# Patient Record
Sex: Female | Born: 1980
Health system: Southern US, Community
[De-identification: ages and names within clinical notes are randomized; demographics above are authoritative.]

## PROBLEM LIST (undated history)

## (undated) DIAGNOSIS — R87619 Unspecified abnormal cytological findings in specimens from cervix uteri: Secondary | ICD-10-CM

## (undated) DIAGNOSIS — IMO0002 Reserved for concepts with insufficient information to code with codable children: Secondary | ICD-10-CM

## (undated) HISTORY — DX: Reserved for concepts with insufficient information to code with codable children: IMO0002

## (undated) HISTORY — DX: Unspecified abnormal cytological findings in specimens from cervix uteri: R87.619

## (undated) HISTORY — PX: LEEP: SHX91

---
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
## 2004-07-17 ENCOUNTER — Other Ambulatory Visit: Admission: RE | Admit: 2004-07-17 | Discharge: 2004-07-17 | Payer: Self-pay | Admitting: Family Medicine

## 2005-07-21 ENCOUNTER — Other Ambulatory Visit: Admission: RE | Admit: 2005-07-21 | Discharge: 2005-07-21 | Payer: Self-pay | Admitting: Family Medicine

## 2006-03-08 ENCOUNTER — Other Ambulatory Visit: Admission: RE | Admit: 2006-03-08 | Discharge: 2006-03-08 | Payer: Self-pay | Admitting: Obstetrics and Gynecology

## 2006-11-25 ENCOUNTER — Other Ambulatory Visit: Admission: RE | Admit: 2006-11-25 | Discharge: 2006-11-25 | Payer: Self-pay | Admitting: Obstetrics and Gynecology

## 2007-06-14 ENCOUNTER — Other Ambulatory Visit: Admission: RE | Admit: 2007-06-14 | Discharge: 2007-06-14 | Payer: Self-pay | Admitting: Obstetrics and Gynecology

## 2008-01-31 ENCOUNTER — Other Ambulatory Visit: Admission: RE | Admit: 2008-01-31 | Discharge: 2008-01-31 | Payer: Self-pay | Admitting: Obstetrics and Gynecology

## 2008-06-01 ENCOUNTER — Inpatient Hospital Stay (HOSPITAL_COMMUNITY): Admission: AD | Admit: 2008-06-01 | Discharge: 2008-06-01 | Payer: Self-pay | Admitting: Obstetrics and Gynecology

## 2008-08-27 ENCOUNTER — Inpatient Hospital Stay (HOSPITAL_COMMUNITY): Admission: RE | Admit: 2008-08-27 | Discharge: 2008-08-29 | Payer: Self-pay | Admitting: Obstetrics and Gynecology

## 2009-01-31 ENCOUNTER — Other Ambulatory Visit: Admission: RE | Admit: 2009-01-31 | Discharge: 2009-01-31 | Payer: Self-pay | Admitting: Obstetrics and Gynecology

## 2009-10-20 ENCOUNTER — Inpatient Hospital Stay (HOSPITAL_COMMUNITY): Admission: AD | Admit: 2009-10-20 | Discharge: 2009-10-20 | Payer: Self-pay | Admitting: Obstetrics and Gynecology

## 2009-11-05 DEATH — deceased

## 2010-02-13 ENCOUNTER — Other Ambulatory Visit: Admission: RE | Admit: 2010-02-13 | Discharge: 2010-02-13 | Payer: Self-pay | Admitting: Obstetrics and Gynecology

## 2010-08-21 ENCOUNTER — Inpatient Hospital Stay (HOSPITAL_COMMUNITY)
Admission: AD | Admit: 2010-08-21 | Discharge: 2010-08-22 | Payer: Self-pay | Source: Home / Self Care | Attending: Obstetrics and Gynecology | Admitting: Obstetrics and Gynecology

## 2010-11-17 LAB — CBC
HCT: 36 % (ref 36.0–46.0)
HCT: 38 % (ref 36.0–46.0)
Hemoglobin: 12.3 g/dL (ref 12.0–15.0)
Hemoglobin: 13.1 g/dL (ref 12.0–15.0)
MCH: 31 pg (ref 26.0–34.0)
MCH: 31.7 pg (ref 26.0–34.0)
MCHC: 34.2 g/dL (ref 30.0–36.0)
MCHC: 34.5 g/dL (ref 30.0–36.0)
MCV: 90.7 fL (ref 78.0–100.0)
MCV: 92 fL (ref 78.0–100.0)
Platelets: 159 10*3/uL (ref 150–400)
RBC: 4.13 MIL/uL (ref 3.87–5.11)
RDW: 13.5 % (ref 11.5–15.5)
RDW: 14 % (ref 11.5–15.5)
WBC: 8.3 10*3/uL (ref 4.0–10.5)

## 2010-11-17 LAB — RPR: RPR Ser Ql: NONREACTIVE

## 2010-11-17 LAB — ABO/RH: ABO/RH(D): O POS

## 2010-11-26 LAB — HCG, QUANTITATIVE, PREGNANCY: hCG, Beta Chain, Quant, S: 4926 m[IU]/mL — ABNORMAL HIGH (ref <5)

## 2010-11-26 LAB — CBC
MCHC: 33.7 g/dL (ref 30.0–36.0)
MCV: 91.2 fL (ref 78.0–100.0)
Platelets: 230 10*3/uL (ref 150–400)
RDW: 12.5 % (ref 11.5–15.5)

## 2010-12-12 IMAGING — US US OB COMP LESS 14 WK
1 series · 14 of 28 positions shown · non-contrast
Comparison: None

CLINICAL DATA: *Abdominal pain; ; QUANTITATIVE BETA HCG LEVEL 6552.

OBSTETRIC <14 WK US AND TRANSVAGINAL OB US
TECHNIQUE: Both transabdominal and transvaginal ultrasound
examinations were performed for complete evaluation of the
gestation as well as the maternal uterus, adnexal regions, and
pelvic cul-de-sac.

[Series 1: us ob comp less 14 wks · 0.18mm/px · 49 acquisitions, 14 frames shown]
[im 2/49]
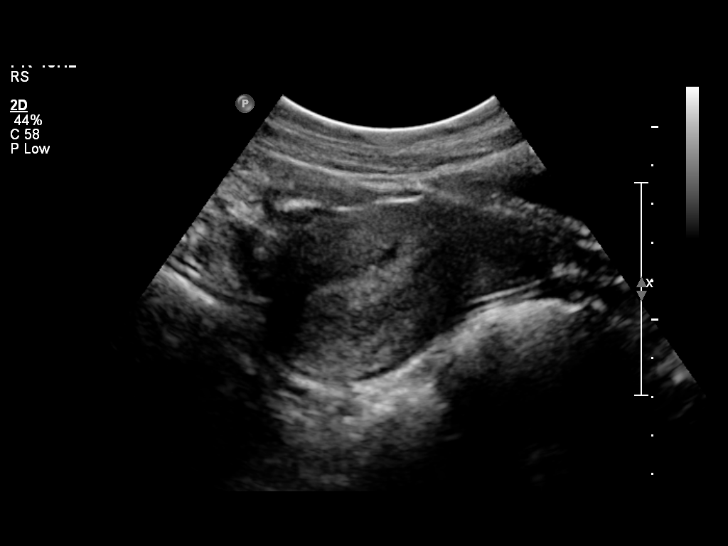
[im 6/49]
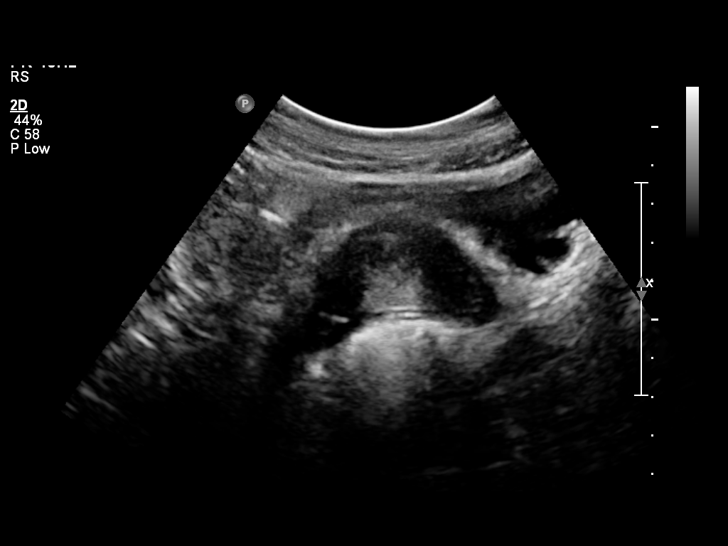
[im 9/49]
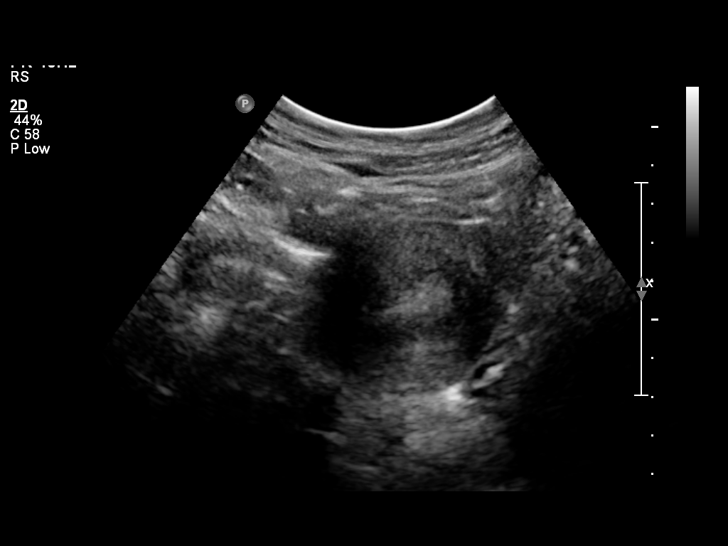
[im 13/49]
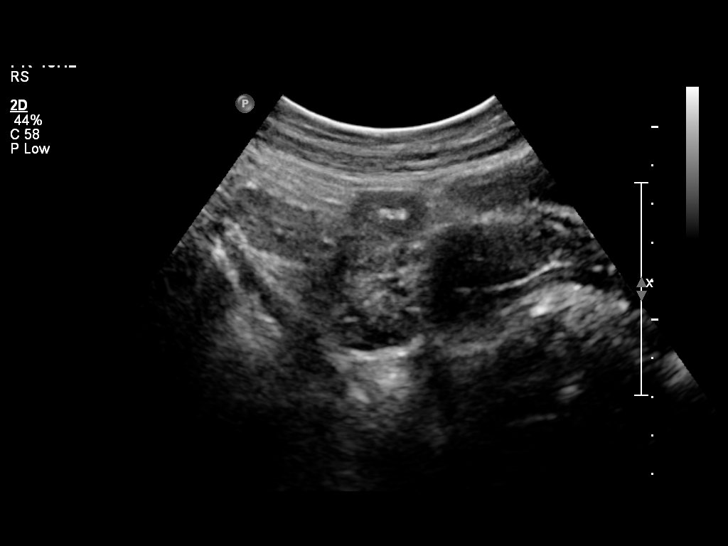
[im 17/49]
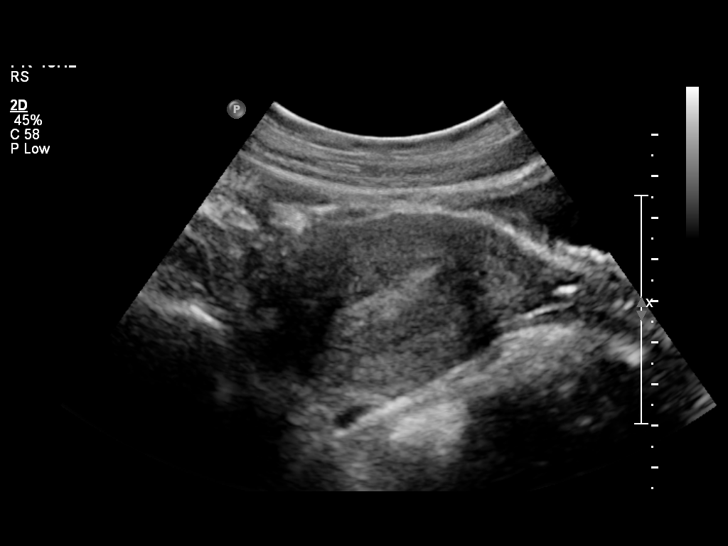
[im 20/49]
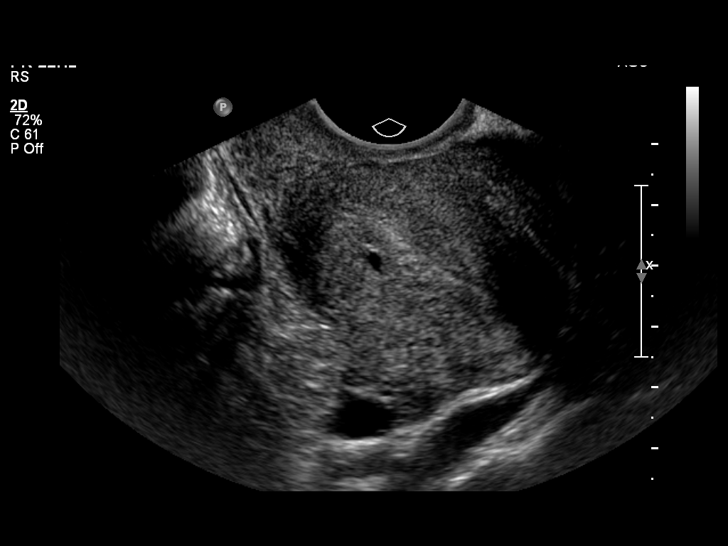
[im 24/49]
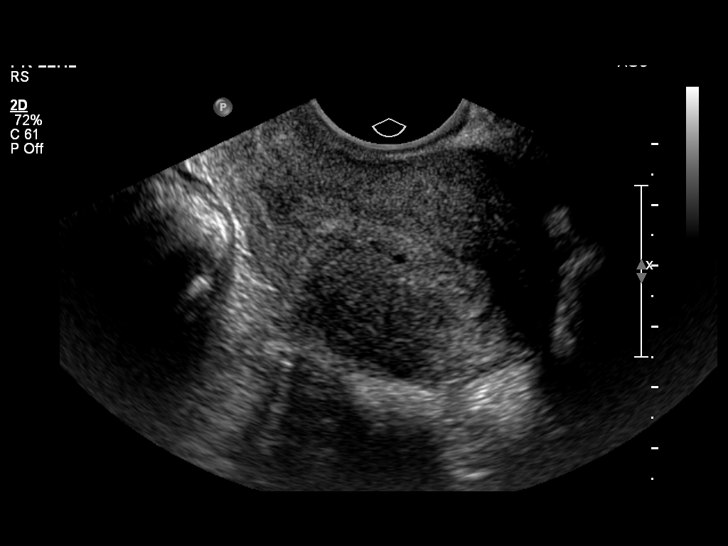
[im 27/49]
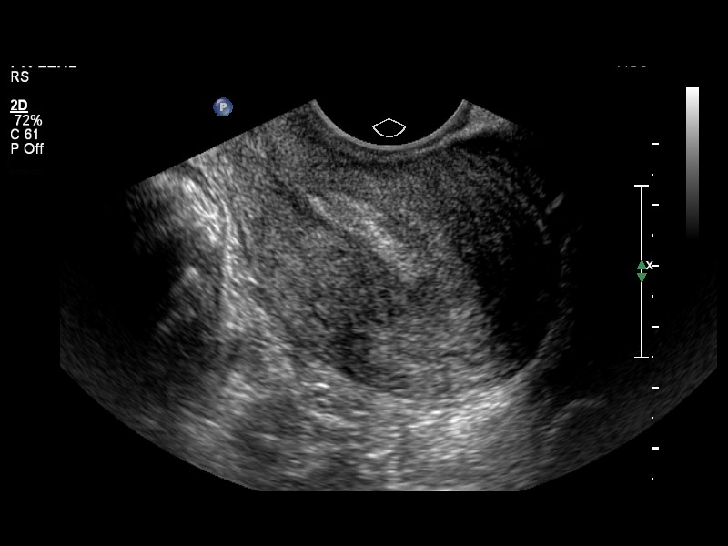
[im 31/49]
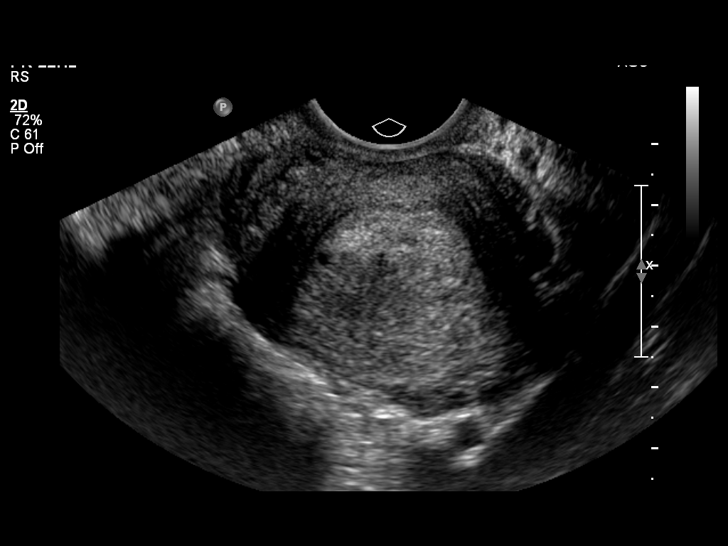
[im 34/49]
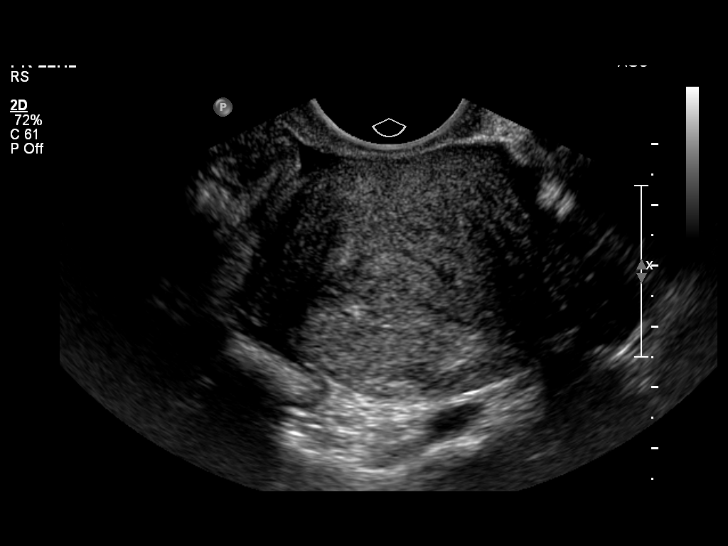
[im 38/49]
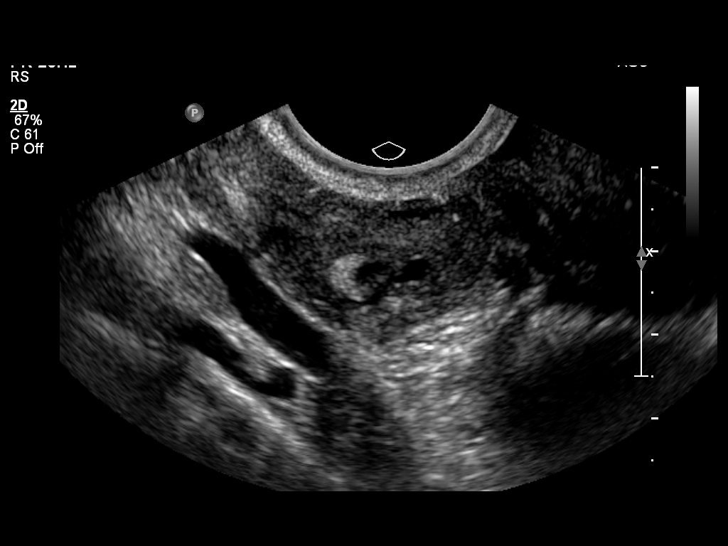
[im 41/49]
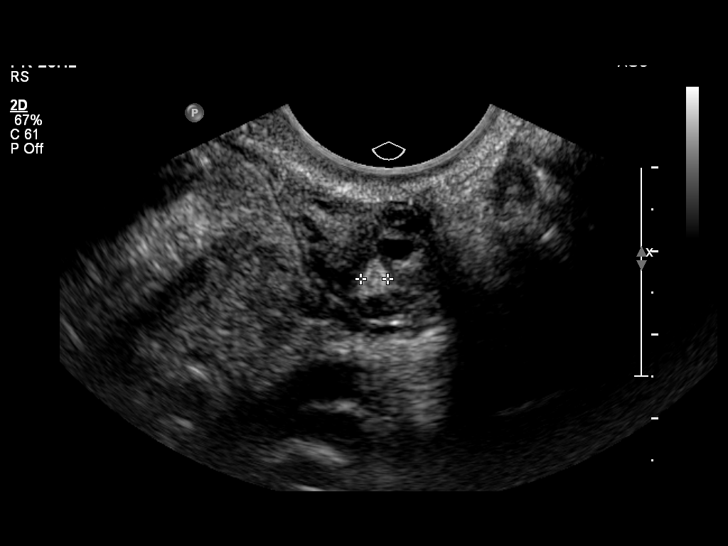
[im 45/49]
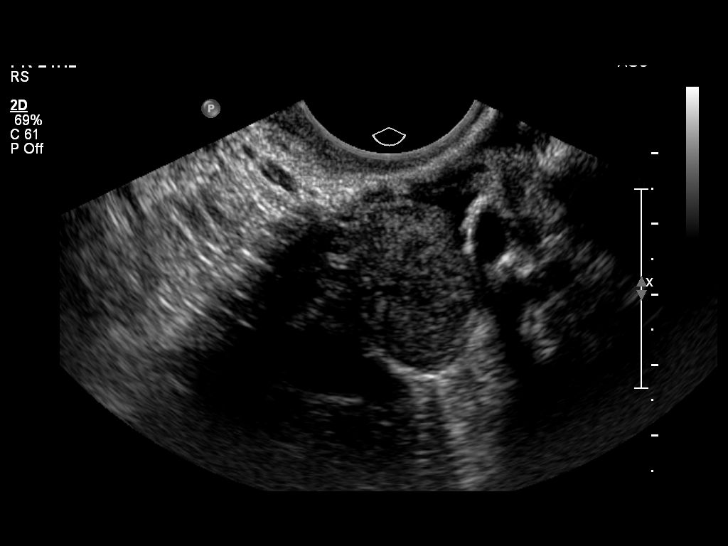
[im 49/49]
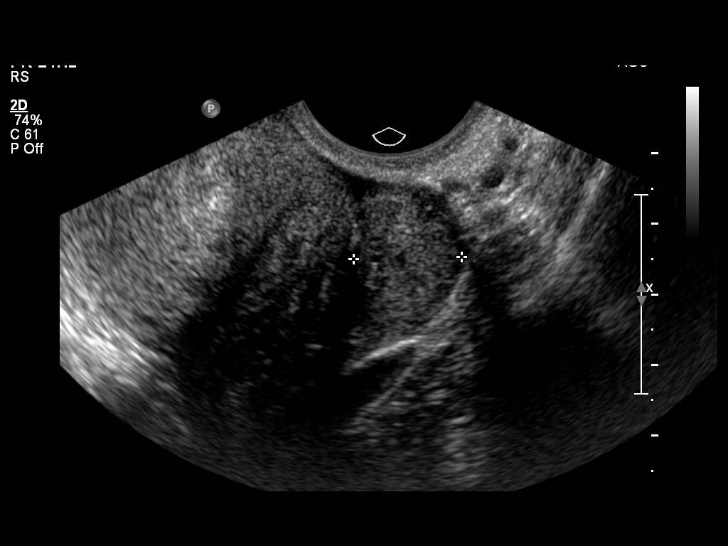

[14 of 28 positions shown; findings below may reference images not displayed]

FINDINGS: No intrauterine pregnancy identified.  The endometrium
appears thickened and mobile, likely blood.  There are a few small
cystic areas within the endometrium.  Uterus is unremarkable.

Ovaries are symmetric in size and echotexture.  Left corpus luteum
cyst seen.  No adnexal masses.  Small amount of free fluid.
IMPRESSION: No intrauterine pregnancy.

Endometrium thickened with small cystic areas.  Material within the
endometrium appears mobile, likely blood.

## 2011-01-20 NOTE — H&P (Signed)
NAME:  Kelsey Obrien, Kelsey Obrien NO.:  [id number]   MEDICAL RECORD NO.:  [id number]        PATIENT TYPE:  WINP   LOCATION:                                FACILITY:  [facility]   PHYSICIAN:  Gerald Leitz, MD          DATE OF BIRTH:  Jun 01, 1981   DATE OF ADMISSION:  08/27/2008  DATE OF DISCHARGE:                              HISTORY & PHYSICAL   The patient scheduled for induction on August 27, 2008.   HISTORY OF PRESENT ILLNESS:  This is a 30 year old G1, P0 at 52-weeks  estimated gestational age based on last menstrual period of November 14, 2007, followed by first trimester ultrasound with estimated date of  delivery August 20, 2008.  Pregnancy was complicated by history of  LEEP procedure.  The patient reports positive fetal movement.  No  leakage of fluid.  No vaginal bleeding.  No regular contractions.   PAST MEDICAL HISTORY:  Negative.   PAST SURGICAL HISTORY:  Negative.   SOCIAL HISTORY:  The patient is married.  Denies tobacco, alcohol or  illegal drug use.  She does follow with her work.   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  Last Pap  smear was Jan 31, 2008, and this was normal.   MEDICATIONS:  Prenatal vitamins.   ALLERGIES:  No known drug allergies.   FAMILY HISTORY:  Noncontributory.   REVIEW OF SYSTEMS:  Negative.   PHYSICAL EXAMINATION:  VITAL SIGNS:  Blood pressure is 126/70 and weight  is 157 pounds.  CARDIOVASCULAR:  Regular rate and rhythm.  LUNGS:  Clear to auscultation bilaterally.  ABDOMEN:  Soft, nontender, and nondistended.  EXTREMITIES:  No clubbing, cyanosis, or edema.  Cervix is 2 cm, 75%  effaced, ballotable, vertex presentation.  Fetal heart rate 140s.   LABORATORY DATA:  Group B strep is positive.  Blood type is O positive.  HIV is nonreactive.  The patient had an abnormal glucose challenge of  150, however, her 3-hour GTT was normal.   IMPRESSION AND PLAN:  A 41-weeks' intrauterine pregnancy postdate with  GBS  positive, __________ induction, penicillin for GBS prophylaxis,  anticipate spontaneous vaginal delivery.      Gerald Leitz, MD  Electronically Signed     TC/MEDQ  D:  08/22/2008  T:  08/23/2008  Job:  [id number]

## 2011-02-15 ENCOUNTER — Other Ambulatory Visit (HOSPITAL_COMMUNITY)
Admission: RE | Admit: 2011-02-15 | Discharge: 2011-02-15 | Disposition: A | Discharge status: Home / Self Care | Payer: BC Managed Care – PPO | Source: Ambulatory Visit | Attending: Obstetrics and Gynecology | Admitting: Obstetrics and Gynecology

## 2011-02-15 DIAGNOSIS — Z01419 Encounter for gynecological examination (general) (routine) without abnormal findings: Secondary | ICD-10-CM | POA: Insufficient documentation

## 2011-02-16 ENCOUNTER — Other Ambulatory Visit: Payer: Self-pay | Admitting: Obstetrics and Gynecology

## 2011-06-08 LAB — URINALYSIS, ROUTINE W REFLEX MICROSCOPIC
Bilirubin Urine: NEGATIVE
Ketones, ur: NEGATIVE
Nitrite: NEGATIVE
Protein, ur: NEGATIVE
pH: 7

## 2011-06-08 LAB — URINE MICROSCOPIC-ADD ON

## 2011-06-12 LAB — CBC
HCT: 31.4 % — ABNORMAL LOW (ref 36.0–46.0)
HCT: 36.5 % (ref 36.0–46.0)
Hemoglobin: 11 g/dL — ABNORMAL LOW (ref 12.0–15.0)
Hemoglobin: 12.8 g/dL (ref 12.0–15.0)
MCHC: 35 g/dL (ref 30.0–36.0)
MCHC: 35.2 g/dL (ref 30.0–36.0)
MCV: 93.1 fL (ref 78.0–100.0)
MCV: 93.8 fL (ref 78.0–100.0)
Platelets: 145 10*3/uL — ABNORMAL LOW (ref 150–400)
Platelets: 187 10*3/uL (ref 150–400)
RBC: 3.35 MIL/uL — ABNORMAL LOW (ref 3.87–5.11)
RBC: 3.92 MIL/uL (ref 3.87–5.11)
RDW: 12.9 % (ref 11.5–15.5)
RDW: 13.2 % (ref 11.5–15.5)
WBC: 10.7 10*3/uL — ABNORMAL HIGH (ref 4.0–10.5)
WBC: 11.8 10*3/uL — ABNORMAL HIGH (ref 4.0–10.5)

## 2011-06-19 LAB — OB RESULTS CONSOLE ABO/RH: RH Type: POSITIVE

## 2011-06-19 LAB — OB RESULTS CONSOLE RUBELLA ANTIBODY, IGM: Rubella: IMMUNE

## 2011-06-19 LAB — OB RESULTS CONSOLE HIV ANTIBODY (ROUTINE TESTING): HIV: NONREACTIVE

## 2011-06-19 LAB — OB RESULTS CONSOLE ANTIBODY SCREEN: Antibody Screen: NEGATIVE

## 2011-09-08 NOTE — L&D Delivery Note (Signed)
Delivery Note At 11:06 AM a viable female was delivered via Vaginal, Spontaneous Delivery (Presentation:Occiput Anterior ;  ).  APGAR:9 , 9; weight .   Placenta status:complete and 3 VC  Cord:  with the following complications:None  .  Cord pH: NA  Anesthesia: Epidural  Episiotomy: none  Lacerations: None Suture Repair: NA Est. Blood Loss (mL): 150 ml  Mom to postpartum.  Baby to nursery-stable.  Kelsey Obrien J. 01/19/2012, 11:17 AM

## 2011-12-29 LAB — OB RESULTS CONSOLE GBS: GBS: NEGATIVE

## 2012-01-15 ENCOUNTER — Telehealth (HOSPITAL_COMMUNITY): Payer: Self-pay | Admitting: *Deleted

## 2012-01-15 ENCOUNTER — Encounter (HOSPITAL_COMMUNITY): Payer: Self-pay | Admitting: *Deleted

## 2012-01-15 NOTE — Telephone Encounter (Signed)
Preadmission screen  

## 2012-01-18 ENCOUNTER — Other Ambulatory Visit: Payer: Self-pay | Admitting: Obstetrics and Gynecology

## 2012-01-19 ENCOUNTER — Encounter (HOSPITAL_COMMUNITY): Payer: Self-pay | Admitting: Anesthesiology

## 2012-01-19 ENCOUNTER — Inpatient Hospital Stay (HOSPITAL_COMMUNITY)
Admission: RE | Admit: 2012-01-19 | Discharge: 2012-01-20 | DRG: 373 | Disposition: A | Discharge status: Home / Self Care | Payer: BC Managed Care – PPO | Source: Ambulatory Visit | Attending: Obstetrics and Gynecology | Admitting: Obstetrics and Gynecology

## 2012-01-19 ENCOUNTER — Inpatient Hospital Stay (HOSPITAL_COMMUNITY): Payer: BC Managed Care – PPO | Admitting: Anesthesiology

## 2012-01-19 ENCOUNTER — Encounter (HOSPITAL_COMMUNITY): Payer: Self-pay

## 2012-01-19 DIAGNOSIS — O480 Post-term pregnancy: Principal | ICD-10-CM

## 2012-01-19 LAB — CBC
HCT: 39.5 % (ref 36.0–46.0)
Hemoglobin: 13.2 g/dL (ref 12.0–15.0)
MCH: 30.1 pg (ref 26.0–34.0)
MCHC: 33.4 g/dL (ref 30.0–36.0)
MCV: 90 fL (ref 78.0–100.0)

## 2012-01-19 MED ORDER — FENTANYL 2.5 MCG/ML BUPIVACAINE 1/10 % EPIDURAL INFUSION (WH - ANES)
14.0000 mL/h | INTRAMUSCULAR | Status: DC
  Administered 2012-01-19: 14 mL/h via EPIDURAL
  Filled 2012-01-19: qty 60

## 2012-01-19 MED ORDER — FLEET ENEMA 7-19 GM/118ML RE ENEM: 1.0000 | ENEMA | RECTAL | Status: DC | PRN

## 2012-01-19 MED ORDER — PHENYLEPHRINE 40 MCG/ML (10ML) SYRINGE FOR IV PUSH (FOR BLOOD PRESSURE SUPPORT)
80.0000 ug | PREFILLED_SYRINGE | INTRAVENOUS | Status: DC | PRN
  Filled 2012-01-19: qty 5

## 2012-01-19 MED ORDER — IBUPROFEN 600 MG PO TABS
600.0000 mg | ORAL_TABLET | Freq: Four times a day (QID) | ORAL | Status: DC
  Administered 2012-01-19 – 2012-01-20 (×4): 600 mg via ORAL
  Filled 2012-01-19 (×4): qty 1

## 2012-01-19 MED ORDER — DIBUCAINE 1 % RE OINT: 1.0000 "application " | TOPICAL_OINTMENT | RECTAL | Status: DC | PRN

## 2012-01-19 MED ORDER — WITCH HAZEL-GLYCERIN EX PADS: 1.0000 "application " | MEDICATED_PAD | CUTANEOUS | Status: DC | PRN

## 2012-01-19 MED ORDER — PRENATAL MULTIVITAMIN CH
1.0000 | ORAL_TABLET | Freq: Every day | ORAL | Status: DC
  Administered 2012-01-19 – 2012-01-20 (×2): 1 via ORAL
  Filled 2012-01-19 (×2): qty 1

## 2012-01-19 MED ORDER — LIDOCAINE HCL (PF) 1 % IJ SOLN
INTRAMUSCULAR | Status: DC | PRN
  Administered 2012-01-19 (×2): 5 mL

## 2012-01-19 MED ORDER — OXYTOCIN 20 UNITS IN LACTATED RINGERS INFUSION - SIMPLE: 125.0000 mL/h | Freq: Once | INTRAVENOUS | Status: DC

## 2012-01-19 MED ORDER — LACTATED RINGERS IV SOLN
INTRAVENOUS | Status: DC
  Administered 2012-01-19 (×2): via INTRAVENOUS

## 2012-01-19 MED ORDER — LIDOCAINE HCL (PF) 1 % IJ SOLN: 30.0000 mL | INTRAMUSCULAR | Status: DC | PRN

## 2012-01-19 MED ORDER — TETANUS-DIPHTH-ACELL PERTUSSIS 5-2.5-18.5 LF-MCG/0.5 IM SUSP: 0.5000 mL | Freq: Once | INTRAMUSCULAR | Status: DC

## 2012-01-19 MED ORDER — IBUPROFEN 600 MG PO TABS: 600.0000 mg | ORAL_TABLET | Freq: Four times a day (QID) | ORAL | Status: DC | PRN

## 2012-01-19 MED ORDER — CITRIC ACID-SODIUM CITRATE 334-500 MG/5ML PO SOLN: 30.0000 mL | ORAL | Status: DC | PRN

## 2012-01-19 MED ORDER — SENNOSIDES-DOCUSATE SODIUM 8.6-50 MG PO TABS
2.0000 | ORAL_TABLET | Freq: Every day | ORAL | Status: DC
  Administered 2012-01-19: 2 via ORAL

## 2012-01-19 MED ORDER — METHYLERGONOVINE MALEATE 0.2 MG PO TABS: 0.2000 mg | ORAL_TABLET | ORAL | Status: DC | PRN

## 2012-01-19 MED ORDER — LANOLIN HYDROUS EX OINT: TOPICAL_OINTMENT | CUTANEOUS | Status: DC | PRN

## 2012-01-19 MED ORDER — BENZOCAINE-MENTHOL 20-0.5 % EX AERO: 1.0000 "application " | INHALATION_SPRAY | CUTANEOUS | Status: DC | PRN

## 2012-01-19 MED ORDER — METHYLERGONOVINE MALEATE 0.2 MG/ML IJ SOLN: 0.2000 mg | INTRAMUSCULAR | Status: DC | PRN

## 2012-01-19 MED ORDER — ONDANSETRON HCL 4 MG/2ML IJ SOLN: 4.0000 mg | INTRAMUSCULAR | Status: DC | PRN

## 2012-01-19 MED ORDER — TERBUTALINE SULFATE 1 MG/ML IJ SOLN: 0.2500 mg | Freq: Once | INTRAMUSCULAR | Status: DC | PRN

## 2012-01-19 MED ORDER — OXYCODONE-ACETAMINOPHEN 5-325 MG PO TABS: 1.0000 | ORAL_TABLET | ORAL | Status: DC | PRN

## 2012-01-19 MED ORDER — OXYTOCIN BOLUS FROM INFUSION
500.0000 mL | Freq: Once | INTRAVENOUS | Status: DC
  Filled 2012-01-19: qty 500

## 2012-01-19 MED ORDER — LACTATED RINGERS IV SOLN: 500.0000 mL | Freq: Once | INTRAVENOUS | Status: DC

## 2012-01-19 MED ORDER — ONDANSETRON HCL 4 MG/2ML IJ SOLN: 4.0000 mg | Freq: Four times a day (QID) | INTRAMUSCULAR | Status: DC | PRN

## 2012-01-19 MED ORDER — ONDANSETRON HCL 4 MG PO TABS: 4.0000 mg | ORAL_TABLET | ORAL | Status: DC | PRN

## 2012-01-19 MED ORDER — EPHEDRINE 5 MG/ML INJ
10.0000 mg | INTRAVENOUS | Status: DC | PRN
  Filled 2012-01-19: qty 4

## 2012-01-19 MED ORDER — BUTORPHANOL TARTRATE 2 MG/ML IJ SOLN: 1.0000 mg | INTRAMUSCULAR | Status: DC | PRN

## 2012-01-19 MED ORDER — ACETAMINOPHEN 325 MG PO TABS: 650.0000 mg | ORAL_TABLET | ORAL | Status: DC | PRN

## 2012-01-19 MED ORDER — LACTATED RINGERS IV SOLN: 500.0000 mL | INTRAVENOUS | Status: DC | PRN

## 2012-01-19 MED ORDER — OXYTOCIN 20 UNITS IN LACTATED RINGERS INFUSION - SIMPLE
1.0000 m[IU]/min | INTRAVENOUS | Status: DC
  Administered 2012-01-19: 2 m[IU]/min via INTRAVENOUS
  Filled 2012-01-19: qty 1000

## 2012-01-19 MED ORDER — SIMETHICONE 80 MG PO CHEW: 80.0000 mg | CHEWABLE_TABLET | ORAL | Status: DC | PRN

## 2012-01-19 MED ORDER — EPHEDRINE 5 MG/ML INJ: 10.0000 mg | INTRAVENOUS | Status: DC | PRN

## 2012-01-19 MED ORDER — DIPHENHYDRAMINE HCL 50 MG/ML IJ SOLN: 12.5000 mg | INTRAMUSCULAR | Status: DC | PRN

## 2012-01-19 MED ORDER — PHENYLEPHRINE 40 MCG/ML (10ML) SYRINGE FOR IV PUSH (FOR BLOOD PRESSURE SUPPORT): 80.0000 ug | PREFILLED_SYRINGE | INTRAVENOUS | Status: DC | PRN

## 2012-01-19 MED ORDER — ZOLPIDEM TARTRATE 5 MG PO TABS: 5.0000 mg | ORAL_TABLET | Freq: Every evening | ORAL | Status: DC | PRN

## 2012-01-19 MED ORDER — DIPHENHYDRAMINE HCL 25 MG PO CAPS: 25.0000 mg | ORAL_CAPSULE | Freq: Four times a day (QID) | ORAL | Status: DC | PRN

## 2012-01-19 NOTE — Anesthesia Preprocedure Evaluation (Signed)

## 2012-01-19 NOTE — H&P (Signed)
Kelsey Obrien is a 31 y.o. female presenting for  Induction due to post dates at 40 wks and 3 days. Pregnancy has been uncomplicated. Pt reports occasional contractions. + FM no lof no VB  OB History    Grav Para Term Preterm Abortions TAB SAB Ect Mult Living   4 2 2  0 1 0 1 0 0 2     Past Medical History  Diagnosis Date  . Abnormal Pap smear    Past Surgical History  Procedure Date  . Leep    Family History: family history includes Depression in her maternal grandmother. Social History:  reports that she has never smoked. She has never used smokeless tobacco. She reports that she does not drink alcohol or use illicit drugs.  Meds Tylenol prn and Flinstone vitamins   Dilation: 4 Effacement (%): 50 Station: -1 Exam by:: Athira Janowicz Blood pressure 121/85, pulse 80, temperature 98 F (36.7 C), temperature source Oral, height 5\' 2"  (1.575 m), weight 70.761 kg (156 lb), last menstrual period 04/11/2011. CV rrr  Lungs clear  Abd Gravid nontender Ext trace edema  Pelvic cx  4/50/-1 Arom clear fluid. FHR category I tracing Toco occasional contraction Prenatal labs: ABO, Rh: O/Positive/-- (10/12 0000) Antibody: Negative (10/12 0000) Rubella:   immune RPR: Nonreactive (10/12 0000)  HBsAg: Negative (10/12 0000)  HIV: Non-reactive (10/12 0000)  GBS: Negative (04/23 0000)   Assessment/Plan: 40 wks and 3 days post dates for induction Arom performed if no cervical change in 1 hr add pitocin for augmentation Anticipate svd.    Kelsey Obrien J. 01/19/2012, 8:19 AM

## 2012-01-19 NOTE — Anesthesia Procedure Notes (Signed)
Epidural Patient location during procedure: OB Start time: 01/19/2012 10:22 AM  Staffing Anesthesiologist: Brayton Caves R Performed by: anesthesiologist   Preanesthetic Checklist Completed: patient identified, site marked, surgical consent, pre-op evaluation, timeout performed, IV checked, risks and benefits discussed and monitors and equipment checked  Epidural Patient position: sitting Prep: site prepped and draped and DuraPrep Patient monitoring: continuous pulse ox and blood pressure Approach: midline Injection technique: LOR air and LOR saline  Needle:  Needle type: Tuohy  Needle gauge: 17 G Needle length: 9 cm Needle insertion depth: 5 cm cm Catheter type: closed end flexible Catheter size: 19 Gauge Catheter at skin depth: 10 cm Test dose: negative  Assessment Events: blood not aspirated, injection not painful, no injection resistance, negative IV test and no paresthesia  Additional Notes Patient identified.  Risk benefits discussed including failed block, incomplete pain control, headache, nerve damage, paralysis, blood pressure changes, nausea, vomiting, reactions to medication both toxic or allergic, and postpartum back pain.  Patient expressed understanding and wished to proceed.  All questions were answered.  Sterile technique used throughout procedure and epidural site dressed with sterile barrier dressing. No paresthesia or other complications noted.The patient did not experience any signs of intravascular injection such as tinnitus or metallic taste in mouth nor signs of intrathecal spread such as rapid motor block. Please see nursing notes for vital signs.

## 2012-01-19 NOTE — Anesthesia Postprocedure Evaluation (Signed)
  Anesthesia Post-op Note  Patient: Kelsey Obrien  Procedure(s) Performed: * No procedures listed *  Patient Location: PACU and Mother/Baby  Anesthesia Type: Epidural  Level of Consciousness: awake, alert  and oriented  Airway and Oxygen Therapy: Patient Spontanous Breathing  Post-op Pain: mild  Post-op Assessment: Patient's Cardiovascular Status Stable, Respiratory Function Stable, Pain level controlled, No headache, No backache, No residual numbness and No residual motor weakness  Post-op Vital Signs: stable  Complications: No apparent anesthesia complications

## 2012-01-20 DIAGNOSIS — O480 Post-term pregnancy: Secondary | ICD-10-CM

## 2012-01-20 LAB — CBC
HCT: 36.1 % (ref 36.0–46.0)
Hemoglobin: 12 g/dL (ref 12.0–15.0)
MCH: 30.2 pg (ref 26.0–34.0)
MCHC: 33.2 g/dL (ref 30.0–36.0)
MCV: 90.9 fL (ref 78.0–100.0)
Platelets: 208 10*3/uL (ref 150–400)
RBC: 3.97 MIL/uL (ref 3.87–5.11)
RDW: 14 % (ref 11.5–15.5)
WBC: 10.2 10*3/uL (ref 4.0–10.5)

## 2012-01-20 MED ORDER — IBUPROFEN 600 MG PO TABS: 600.0000 mg | ORAL_TABLET | Freq: Four times a day (QID) | ORAL | Status: AC | PRN

## 2012-01-20 NOTE — Discharge Summary (Signed)
Obstetric Discharge Summary Reason for Admission: induction of labor Prenatal Procedures: none Intrapartum Procedures: spontaneous vaginal delivery Postpartum Procedures: none Complications-Operative and Postpartum: none Hemoglobin  Date Value Range Status  01/20/2012 12.0  12.0-15.0 (g/dL) Final     HCT  Date Value Range Status  01/20/2012 36.1  36.0-46.0 (%) Final    Physical Exam:  General: alert and cooperative Lochia: appropriate Uterine Fundus: firm Incision: NA DVT Evaluation: No evidence of DVT seen on physical exam.  Discharge Diagnoses: Term Pregnancy-delivered  Discharge Information: Date: 01/20/2012 Activity: pelvic rest Diet: routine Medications: Ibuprofen and flinstone Condition: stable Instructions: refer to practice specific booklet Discharge to: home Follow-up Information    Follow up with Jessee Avers., MD in 6 weeks.   Contact information:   [address] [place] [address] [phone]          Newborn Data: Live born female  Birth Weight: 7 lb 12.9 oz (3541 g) APGAR: 9, 9  Home with mother.  Nicoles Sedlacek J. 01/20/2012, 8:43 AM

## 2012-01-20 NOTE — Progress Notes (Signed)
Post Partum Day1 s/p SVD Subjective: no complaints, up ad lib, voiding and tolerating PO Pt desires early discharge   Objective: Blood pressure 116/79, pulse 88, temperature 97.8 F (36.6 C), temperature source Oral, resp. rate 18, height 5\' 2"  (1.575 m), weight 70.761 kg (156 lb), last menstrual period 04/11/2011, SpO2 97.00%, unknown if currently breastfeeding.  Physical Exam:  General: alert and cooperative Lochia: appropriate Uterine Fundus: firm Incision: NA DVT Evaluation: No evidence of DVT seen on physical exam.   Basename 01/20/12 0525 01/19/12 0754  HGB 12.0 13.2  HCT 36.1 39.5    Assessment/Plan: Discharge home and Breastfeeding   LOS: 1 day   Jameis Newsham J. 01/20/2012, 8:38 AM

## 2012-02-10 ENCOUNTER — Ambulatory Visit (HOSPITAL_COMMUNITY)
Admission: RE | Admit: 2012-02-10 | Discharge: 2012-02-10 | Disposition: A | Discharge status: Home / Self Care | Payer: BC Managed Care – PPO | Source: Ambulatory Visit | Attending: Obstetrics and Gynecology | Admitting: Obstetrics and Gynecology

## 2012-02-10 ENCOUNTER — Ambulatory Visit (HOSPITAL_COMMUNITY): Admission: RE | Admit: 2012-02-10 | Payer: BC Managed Care – PPO | Source: Ambulatory Visit

## 2012-02-10 NOTE — Progress Notes (Addendum)
Infant Lactation Consultation Outpatient Visit Note  Patient Name: Kelsey Obrien  ZOXW name: Kelsey Obrien Date of Birth: Jun 10, 1981                      DOB- [birth year]                      Today's date- 02/10/12 (41 weeks old) Birth Weight:                                        Birth weight- 7 lbs. 12 oz.  Today's weight - 8 lbs 2oz Gestational Age at Delivery: Gestational Age: <None>  39 weeks Type of Delivery: Vaginal Delivery  Breastfeeding History Frequency of Breastfeeding: every 3 hours Length of Feeding: 10 mins per side Voids: 8-9 Stools: 4 yellow seedy  Supplementing / Method: Pumping:  Type of Pump: medela PIS   Frequency:  After every other  Volume:  2 oz              Supplementing 1 oz after every feeding by bottle for 1 1/2 weeks per doctor's orders   Consultation Evaluation:  Initial Feeding Assessment: Pre-feed Weight:  3686 gm Post-feed Weight:  3741gm Amount Transferred:  54 ml Comments: left breast, skin to skin, in X cradle hold using alternate breast massage      Additional Feeding Assessment: Pre-feed Weight: 3741 gm Post-feed Weight: 3762 gm Amount Transferred:  25 ml Comments: from right breast using X cradle hold, and alternate breast massage Total Breast milk Transferred this Visit: 79 ml  Katniss comes in today as her baby had been a slow weight gainer in first 1-2 weeks.  Dr. Chestine Spore recommended supplementing Kelsey Obrien with 1-2 oz after breast feeding and pumping pc.  Iyanla has been following this regime for about 10 days.  Baby remains sleepy at the breast, nursing actively for about 5-10 mins, but she leaves him on for 15 or more mins.  She follows breast feeding with a bottle which he drinks quickly.  She doesn't want him to get used to the bottle.  Watched Bennye Alm in Counselling psychologist, not enough depth to latch.  Relatched him with my assistance, and explanation.  Mom stated that Kelsey Obrien hadn't been latching as deeply.  He nursed  actively, and Mom using breast compression.  She also was encouraged to strip him down to diaper.    Plan:  Try to discontinue supplement bottles To feed Kelsey Obrien closer to every 2 hrs during the day Position Grant skin to skin, turned tummy to tummy Cross cradle hold rather than cradle`hold to control latch more effectively Use breast compression to increase milk transfer from breast Pump only once a day after nursing   Follow-Up Monday, June 10th at [facility] for weight check     Judee Clara 02/10/2012, 9:24 AM

## 2012-02-15 ENCOUNTER — Ambulatory Visit (HOSPITAL_COMMUNITY)
Admission: RE | Admit: 2012-02-15 | Discharge: 2012-02-15 | Disposition: A | Discharge status: Home / Self Care | Payer: BC Managed Care – PPO | Source: Ambulatory Visit | Attending: Obstetrics and Gynecology | Admitting: Obstetrics and Gynecology

## 2012-02-15 NOTE — Progress Notes (Signed)
Adult Lactation Consultation Outpatient Visit Note  Patient Name: Kelsey Obrien    BABY: Tracey Harries Date of Birth: 04-26-1981                        DOB: [birth year] Gestational Age at Delivery:39 WEEKS  BIRTH WEIGHT: 7-12 Type of Delivery:NVD                                WEIGHT TODAY: 8-3.3  Breastfeeding History: Frequency of Breastfeeding: EVERY 2-2.5 HOURS Length of Feeding: 30 MINUTES Voids: QS Stools: QS  Supplementing / Method:NONE Pumping:  Type of Pump:PUMP IN STYLE   Frequency:ONCE PER DAY  Volume:  60 MLS  Comments:    Consultation Evaluation:Mom and baby here for follow up for slow weight gain.  Baby was receiving some formula and EBM supplements until 1 week ago.  Mom feels baby has been latching and nursing better.  Baby has gained 1 oz/5 days.  Observed baby latch on to left breast using cross cradle hold.  Assisted mom with obtaining deeper latch.  Grant sucks/swallows actively for first 5 minutes then requires stimulation and breast compression for better feeding.  Mom has a 26 year old and 90 mo old at home also.  Recommended to mom that she pumps after daytime feedings and supplements any of EBM to baby.  Plan is to continue this for 1-2 weeks and watch weight closely.  Initial Feeding Assessment:LEFT BREAST 15 MINUTES/RIGHT BREAST 15 MINUTES Pre-feed Weight:8-3.3 Post-feed Weight:8-5.9 Amount Transferred:2.6 0Z Comments:  Additional Feeding Assessment: Pre-feed Weight: Post-feed Weight: Amount Transferred: Comments:  Additional Feeding Assessment: Pre-feed Weight: Post-feed Weight: Amount Transferred: Comments:  Total Breast milk Transferred this Visit: 2.6 OZ Total Supplement Given: NONE  Additional Interventions:   Follow-Up Pedi weight check 02/19/12.  Will call [facility] office prn      Hansel Feinstein 02/15/2012, 9:36 AM

## 2012-05-30 ENCOUNTER — Other Ambulatory Visit: Payer: Self-pay | Admitting: Obstetrics and Gynecology

## 2012-05-30 ENCOUNTER — Other Ambulatory Visit (HOSPITAL_COMMUNITY)
Admission: RE | Admit: 2012-05-30 | Discharge: 2012-05-30 | Disposition: A | Discharge status: Home / Self Care | Payer: BC Managed Care – PPO | Source: Ambulatory Visit | Attending: Obstetrics and Gynecology | Admitting: Obstetrics and Gynecology

## 2012-05-30 DIAGNOSIS — Z01419 Encounter for gynecological examination (general) (routine) without abnormal findings: Secondary | ICD-10-CM | POA: Insufficient documentation

## 2014-04-24 ENCOUNTER — Other Ambulatory Visit (HOSPITAL_COMMUNITY)
Admission: RE | Admit: 2014-04-24 | Discharge: 2014-04-24 | Disposition: A | Discharge status: Home / Self Care | Payer: BC Managed Care – PPO | Source: Ambulatory Visit | Attending: Obstetrics and Gynecology | Admitting: Obstetrics and Gynecology

## 2014-04-24 ENCOUNTER — Other Ambulatory Visit: Payer: Self-pay | Admitting: Obstetrics and Gynecology

## 2014-04-24 DIAGNOSIS — R87810 Cervical high risk human papillomavirus (HPV) DNA test positive: Secondary | ICD-10-CM | POA: Insufficient documentation

## 2014-04-24 DIAGNOSIS — Z124 Encounter for screening for malignant neoplasm of cervix: Secondary | ICD-10-CM | POA: Diagnosis not present

## 2014-04-24 DIAGNOSIS — Z1151 Encounter for screening for human papillomavirus (HPV): Secondary | ICD-10-CM | POA: Diagnosis present

## 2014-04-25 LAB — CYTOLOGY - PAP

## 2014-07-09 ENCOUNTER — Encounter (HOSPITAL_COMMUNITY): Payer: Self-pay

## 2015-04-30 ENCOUNTER — Other Ambulatory Visit: Payer: Self-pay | Admitting: Obstetrics and Gynecology

## 2015-04-30 ENCOUNTER — Other Ambulatory Visit (HOSPITAL_COMMUNITY)
Admission: RE | Admit: 2015-04-30 | Discharge: 2015-04-30 | Disposition: A | Discharge status: Home / Self Care | Payer: 59 | Source: Ambulatory Visit | Attending: Obstetrics and Gynecology | Admitting: Obstetrics and Gynecology

## 2015-04-30 DIAGNOSIS — Z1151 Encounter for screening for human papillomavirus (HPV): Secondary | ICD-10-CM | POA: Diagnosis present

## 2015-04-30 DIAGNOSIS — R87810 Cervical high risk human papillomavirus (HPV) DNA test positive: Secondary | ICD-10-CM | POA: Diagnosis present

## 2015-04-30 DIAGNOSIS — Z01411 Encounter for gynecological examination (general) (routine) with abnormal findings: Secondary | ICD-10-CM | POA: Insufficient documentation

## 2015-05-01 LAB — CYTOLOGY - PAP

## 2015-06-11 ENCOUNTER — Other Ambulatory Visit: Payer: Self-pay | Admitting: Obstetrics and Gynecology

## 2015-09-23 ENCOUNTER — Emergency Department (HOSPITAL_COMMUNITY)
Admission: EM | Admit: 2015-09-23 | Discharge: 2015-09-23 | Disposition: A | Discharge status: Home / Self Care | Payer: BLUE CROSS/BLUE SHIELD | Attending: Emergency Medicine | Admitting: Emergency Medicine

## 2015-09-23 ENCOUNTER — Encounter (HOSPITAL_COMMUNITY): Payer: Self-pay | Admitting: Family Medicine

## 2015-09-23 DIAGNOSIS — N390 Urinary tract infection, site not specified: Secondary | ICD-10-CM | POA: Insufficient documentation

## 2015-09-23 DIAGNOSIS — Z79899 Other long term (current) drug therapy: Secondary | ICD-10-CM | POA: Insufficient documentation

## 2015-09-23 DIAGNOSIS — Z3202 Encounter for pregnancy test, result negative: Secondary | ICD-10-CM | POA: Diagnosis not present

## 2015-09-23 DIAGNOSIS — R319 Hematuria, unspecified: Secondary | ICD-10-CM | POA: Diagnosis present

## 2015-09-23 LAB — URINALYSIS, ROUTINE W REFLEX MICROSCOPIC
Bilirubin Urine: NEGATIVE
GLUCOSE, UA: NEGATIVE mg/dL
KETONES UR: 40 mg/dL — AB
LEUKOCYTES UA: NEGATIVE
Nitrite: NEGATIVE
PROTEIN: 100 mg/dL — AB
Specific Gravity, Urine: 1.019 (ref 1.005–1.030)
pH: 7.5 (ref 5.0–8.0)

## 2015-09-23 LAB — URINE MICROSCOPIC-ADD ON

## 2015-09-23 LAB — POC URINE PREG, ED: Preg Test, Ur: NEGATIVE

## 2015-09-23 MED ORDER — CEPHALEXIN 500 MG PO CAPS: 500.0000 mg | ORAL_CAPSULE | Freq: Four times a day (QID) | ORAL | Status: DC

## 2015-09-23 MED ORDER — CEPHALEXIN 500 MG PO CAPS
500.0000 mg | ORAL_CAPSULE | Freq: Once | ORAL | Status: AC
Start: 2015-09-23 — End: 2015-09-23
  Administered 2015-09-23: 500 mg via ORAL
  Filled 2015-09-23: qty 1

## 2015-09-23 MED ORDER — PHENAZOPYRIDINE HCL 200 MG PO TABS
200.0000 mg | ORAL_TABLET | Freq: Three times a day (TID) | ORAL | Status: DC
  Administered 2015-09-23: 200 mg via ORAL
  Filled 2015-09-23: qty 1

## 2015-09-23 MED ORDER — ONDANSETRON 8 MG PO TBDP
8.0000 mg | ORAL_TABLET | Freq: Once | ORAL | Status: AC
Start: 2015-09-23 — End: 2015-09-23
  Administered 2015-09-23: 8 mg via ORAL
  Filled 2015-09-23: qty 1

## 2015-09-23 MED ORDER — PHENAZOPYRIDINE HCL 200 MG PO TABS
200.0000 mg | ORAL_TABLET | Freq: Three times a day (TID) | ORAL | Status: DC
Start: 2015-09-23 — End: 2019-07-28

## 2015-09-23 MED ORDER — ONDANSETRON HCL 4 MG PO TABS: 4.0000 mg | ORAL_TABLET | Freq: Four times a day (QID) | ORAL | Status: DC

## 2015-09-23 NOTE — ED Notes (Signed)
Pt is complaining of diffuse abd pain but mostly in her left lower quad with vomiting. Also, has blood in urine. Pt believes she has a kidney infection. Increase in urgency and frequency and burning with urination.

## 2015-09-23 NOTE — ED Notes (Signed)
Pt has ambulated to the restroom multiple times while here in the ED.

## 2015-09-23 NOTE — Discharge Instructions (Signed)

## 2015-09-23 NOTE — ED Provider Notes (Signed)
CSN: [id number]     Arrival date & time 09/23/15  0150 History   First MD Initiated Contact with Patient 09/23/15 [phone]     Chief Complaint  Patient presents with  . Emesis  . Hematuria     (Consider location/radiation/quality/duration/timing/severity/associated sxs/prior Treatment) HPI Comments: Patient presents with complaint of painful urination for the past 4 days. No fever but reports chills. She reports symptoms worsening over time and now has nausea with vomiting, worse hot/cold chills and, tonight, started to see blood in her urine. She states that she feels like she has to urinate and continues to feel the need to go after she is finished. No vaginal discharge or flank pain. No history of kidney stones.   Patient is a 35 y.o. female presenting with hematuria. The history is provided by the patient. No language interpreter was used.  Hematuria This is a new problem. The current episode started in the past 7 days. The problem has been gradually worsening. Associated symptoms include abdominal pain, nausea and vomiting. Pertinent negatives include no chills or fever.    Past Medical History  Diagnosis Date  . Abnormal Pap smear    Past Surgical History  Procedure Laterality Date  . Leep     Family History  Problem Relation Age of Onset  . Depression Maternal Grandmother    Social History  Substance Use Topics  . Smoking status: Never Smoker   . Smokeless tobacco: Never Used  . Alcohol Use: No   OB History    Gravida Para Term Preterm AB TAB SAB Ectopic Multiple Living   4 3 3  0 1 0 1 0 0 3     Review of Systems  Constitutional: Negative for fever and chills.  Gastrointestinal: Positive for nausea, vomiting and abdominal pain.  Genitourinary: Positive for dysuria, frequency and hematuria. Negative for flank pain and vaginal discharge.  Musculoskeletal: Negative.   Neurological: Negative.       Allergies  Review of patient's allergies indicates no known  allergies.  Home Medications   Prior to Admission medications   Medication Sig Start Date End Date Taking? Authorizing Provider  acetaminophen (TYLENOL) 500 MG tablet Take 500 mg by mouth every 6 (six) hours as needed. For headaches.    Historical Provider, MD  calcium carbonate (TUMS - DOSED IN MG ELEMENTAL CALCIUM) 500 MG chewable tablet Chew 1 tablet by mouth daily as needed. For heartburn.    Historical Provider, MD  oxymetazoline (AFRIN) 0.05 % nasal spray Place 2 sprays into the nose 2 (two) times daily. For decongestion.    Historical Provider, MD  Pediatric Multivit-Minerals-C (FLINTSTONES GUMMIES PO) Take 2 strips by mouth daily.    Historical Provider, MD   BP 133/88 mmHg  Pulse 87  Temp(Src) 97.8 F (36.6 C) (Oral)  Resp 20  Ht 5\' 2"  (1.575 m)  Wt 65.772 kg  BMI 26.51 kg/m2  SpO2 100%  LMP 09/04/2015 Physical Exam  Constitutional: She is oriented to person, place, and time. She appears well-developed and well-nourished.  HENT:  Head: Normocephalic.  Neck: Normal range of motion. Neck supple.  Cardiovascular: Normal rate and regular rhythm.   Pulmonary/Chest: Effort normal and breath sounds normal.  Abdominal: Soft. Bowel sounds are normal. There is tenderness. There is no rebound and no guarding.  LLQ tenderness to soft abdomen.  Genitourinary:  No CVA tenderness.   Musculoskeletal: Normal range of motion.  Neurological: She is alert and oriented to person, place, and time.  Skin: Skin is  warm and dry. No rash noted.  Psychiatric: She has a normal mood and affect.    ED Course  Procedures (including critical care time) Labs Review Labs Reviewed  URINALYSIS, ROUTINE W REFLEX MICROSCOPIC (NOT AT [facility]) - Abnormal; Notable for the following:    APPearance TURBID (*)    Hgb urine dipstick LARGE (*)    Ketones, ur 40 (*)    Protein, ur 100 (*)    All other components within normal limits  URINE MICROSCOPIC-ADD ON - Abnormal; Notable for the following:    Squamous  Epithelial / LPF 0-5 (*)    Bacteria, UA MANY (*)    All other components within normal limits  POC URINE PREG, ED   Results for orders placed or performed during the hospital encounter of 09/23/15  Urinalysis, Routine w reflex microscopic-may I&O cath if menses (not at [facility])  Result Value Ref Range   Color, Urine YELLOW YELLOW   APPearance TURBID (A) CLEAR   Specific Gravity, Urine 1.019 1.005 - 1.030   pH 7.5 5.0 - 8.0   Glucose, UA NEGATIVE NEGATIVE mg/dL   Hgb urine dipstick LARGE (A) NEGATIVE   Bilirubin Urine NEGATIVE NEGATIVE   Ketones, ur 40 (A) NEGATIVE mg/dL   Protein, ur 100 (A) NEGATIVE mg/dL   Nitrite NEGATIVE NEGATIVE   Leukocytes, UA NEGATIVE NEGATIVE  Urine microscopic-add on  Result Value Ref Range   Squamous Epithelial / LPF 0-5 (A) NONE SEEN   WBC, UA 6-30 0 - 5 WBC/hpf   RBC / HPF TOO NUMEROUS TO COUNT 0 - 5 RBC/hpf   Bacteria, UA MANY (A) NONE SEEN  POC urine preg, ED (not at [facility])  Result Value Ref Range   Preg Test, Ur NEGATIVE NEGATIVE    Imaging Review No results found. I have personally reviewed and evaluated these images and lab results as part of my medical decision-making.   EKG Interpretation None      MDM   Final diagnoses:  None    1. UTI  Patient presents with symptoms of UTI of frequency, dysuria and hematuria. She is afebrile here with normal VS. No vomiting in ED. She feels better with medications and reports less pain. Doubt pyelonephritis - no fever, no flank pain. She can be discharged home with antibiotics, pyridium, zofran. Return precautions discussed.     Charlann Lange, PA-C 09/23/15 0505  Ripley Fraise, MD 09/23/15 [phone]

## 2016-01-20 ENCOUNTER — Other Ambulatory Visit (HOSPITAL_COMMUNITY)
Admission: RE | Admit: 2016-01-20 | Discharge: 2016-01-20 | Disposition: A | Discharge status: Home / Self Care | Payer: BLUE CROSS/BLUE SHIELD | Source: Ambulatory Visit | Attending: Obstetrics and Gynecology | Admitting: Obstetrics and Gynecology

## 2016-01-20 ENCOUNTER — Other Ambulatory Visit: Payer: Self-pay | Admitting: Obstetrics and Gynecology

## 2016-01-20 DIAGNOSIS — Z01419 Encounter for gynecological examination (general) (routine) without abnormal findings: Secondary | ICD-10-CM | POA: Diagnosis not present

## 2016-01-22 LAB — CYTOLOGY - PAP

## 2016-07-22 ENCOUNTER — Other Ambulatory Visit (HOSPITAL_COMMUNITY)
Admission: RE | Admit: 2016-07-22 | Discharge: 2016-07-22 | Disposition: A | Discharge status: Home / Self Care | Payer: BLUE CROSS/BLUE SHIELD | Source: Ambulatory Visit | Attending: Obstetrics and Gynecology | Admitting: Obstetrics and Gynecology

## 2016-07-22 ENCOUNTER — Other Ambulatory Visit: Payer: Self-pay | Admitting: Obstetrics and Gynecology

## 2016-07-22 DIAGNOSIS — Z01419 Encounter for gynecological examination (general) (routine) without abnormal findings: Secondary | ICD-10-CM | POA: Insufficient documentation

## 2016-07-23 LAB — CYTOLOGY - PAP: Diagnosis: NEGATIVE

## 2017-07-27 ENCOUNTER — Other Ambulatory Visit: Payer: Self-pay | Admitting: Obstetrics and Gynecology

## 2017-07-27 ENCOUNTER — Other Ambulatory Visit (HOSPITAL_COMMUNITY)
Admission: RE | Admit: 2017-07-27 | Discharge: 2017-07-27 | Disposition: A | Discharge status: Home / Self Care | Payer: BLUE CROSS/BLUE SHIELD | Source: Ambulatory Visit | Attending: Obstetrics and Gynecology | Admitting: Obstetrics and Gynecology

## 2017-07-27 DIAGNOSIS — Z124 Encounter for screening for malignant neoplasm of cervix: Secondary | ICD-10-CM | POA: Diagnosis not present

## 2017-08-02 LAB — CYTOLOGY - PAP
Diagnosis: NEGATIVE
HPV (WINDOPATH): NOT DETECTED

## 2019-06-19 ENCOUNTER — Telehealth: Payer: Self-pay

## 2019-06-19 NOTE — Telephone Encounter (Signed)

## 2019-06-20 ENCOUNTER — Other Ambulatory Visit: Payer: Self-pay

## 2019-06-20 ENCOUNTER — Ambulatory Visit (INDEPENDENT_AMBULATORY_CARE_PROVIDER_SITE_OTHER): Payer: BC Managed Care – PPO | Admitting: Plastic Surgery

## 2019-06-20 ENCOUNTER — Encounter: Payer: Self-pay | Admitting: Plastic Surgery

## 2019-06-20 DIAGNOSIS — L989 Disorder of the skin and subcutaneous tissue, unspecified: Secondary | ICD-10-CM | POA: Diagnosis not present

## 2019-06-20 NOTE — Progress Notes (Signed)
Patient ID: Kelsey Obrien, female    DOB: 1980-12-05, 38 y.o.   MRN: [id number]   Chief Complaint  Patient presents with  . Skin Problem    The patient is a 38 year old white female here for evaluation of a changing skin lesion.  She has been seen by dermatologist and sent for [place] for further evaluation.  She has a raised 5 mm skin lesion on her right cheek at her periorbital line.  There is some hyperpigmentation to it.  She states she has had it for as long as she can remember.  It is getting larger and somewhat irritating.  She had another lesion removed but was benign. She also has a mole on her right periorbital upper lid.  If possible she would like to have this removed at the same time.   Review of Systems  Constitutional: Negative.   HENT: Negative.   Eyes: Negative.   Respiratory: Negative.   Gastrointestinal: Negative.   Genitourinary: Negative.   Musculoskeletal: Negative.   Psychiatric/Behavioral: Negative.     Past Medical History:  Diagnosis Date  . Abnormal Pap smear     Past Surgical History:  Procedure Laterality Date  . LEEP        Current Outpatient Medications:  .  aspirin-acetaminophen-caffeine (EXCEDRIN MIGRAINE) 250-250-65 MG tablet, Take 2 tablets by mouth every 6 (six) hours as needed (for headache)., Disp: , Rfl:  .  calcium carbonate (TUMS - DOSED IN MG ELEMENTAL CALCIUM) 500 MG chewable tablet, Chew 1-2 tablets by mouth daily as needed. For heartburn., Disp: , Rfl:  .  cephALEXin (KEFLEX) 500 MG capsule, Take 1 capsule (500 mg total) by mouth 4 (four) times daily. (Patient not taking: Reported on 06/20/2019), Disp: 20 capsule, Rfl: 0 .  ondansetron (ZOFRAN) 4 MG tablet, Take 1 tablet (4 mg total) by mouth every 6 (six) hours. (Patient not taking: Reported on 06/20/2019), Disp: 12 tablet, Rfl: 0 .  oxymetazoline (AFRIN) 0.05 % nasal spray, Place 2 sprays into the nose 2 (two) times daily as needed for congestion. For decongestion., Disp: ,  Rfl:  .  phenazopyridine (PYRIDIUM) 200 MG tablet, Take 1 tablet (200 mg total) by mouth 3 (three) times daily. (Patient not taking: Reported on 06/20/2019), Disp: 6 tablet, Rfl: 0   Objective:   Vitals:   06/20/19 0950  BP: 123/69  Pulse: 77  Temp: 98.2 F (36.8 C)  SpO2: 99%    Physical Exam Vitals signs and nursing note reviewed.  Constitutional:      Appearance: Normal appearance.  HENT:     Head: Normocephalic and atraumatic.  Cardiovascular:     Rate and Rhythm: Normal rate.  Pulmonary:     Effort: Pulmonary effort is normal.  Abdominal:     General: Abdomen is flat. There is no distension.  Musculoskeletal:        General: No swelling.  Skin:    General: Skin is warm.     Coloration: Skin is not jaundiced.     Findings: No bruising.  Neurological:     General: No focal deficit present.     Mental Status: She is alert and oriented to person, place, and time.  Psychiatric:        Mood and Affect: Mood normal.        Behavior: Behavior normal.        Thought Content: Thought content normal.     Assessment & Plan:  Changing skin lesion  Recommend excision of  the right cheek changing skin lesion. We can remove the right upper lid mole at the same time. Pictures were obtained of the patient and placed in the chart with the patient's or guardian's permission.  [place], DO

## 2019-07-28 ENCOUNTER — Encounter: Payer: Self-pay | Admitting: Plastic Surgery

## 2019-07-28 ENCOUNTER — Ambulatory Visit (INDEPENDENT_AMBULATORY_CARE_PROVIDER_SITE_OTHER): Payer: Self-pay | Admitting: Plastic Surgery

## 2019-07-28 ENCOUNTER — Other Ambulatory Visit: Payer: Self-pay

## 2019-07-28 ENCOUNTER — Ambulatory Visit (INDEPENDENT_AMBULATORY_CARE_PROVIDER_SITE_OTHER): Payer: BC Managed Care – PPO | Admitting: Plastic Surgery

## 2019-07-28 ENCOUNTER — Other Ambulatory Visit (HOSPITAL_COMMUNITY)
Admission: RE | Admit: 2019-07-28 | Discharge: 2019-07-28 | Disposition: A | Discharge status: Home / Self Care | Payer: BC Managed Care – PPO | Source: Ambulatory Visit | Attending: Plastic Surgery | Admitting: Plastic Surgery

## 2019-07-28 VITALS — BP 119/80 | HR 79 | Temp 97.7°F | Ht 62.0 in | Wt 146.6 lb

## 2019-07-28 DIAGNOSIS — L989 Disorder of the skin and subcutaneous tissue, unspecified: Secondary | ICD-10-CM | POA: Diagnosis present

## 2019-07-28 DIAGNOSIS — Z20822 Contact with and (suspected) exposure to covid-19: Secondary | ICD-10-CM

## 2019-07-28 DIAGNOSIS — D229 Melanocytic nevi, unspecified: Secondary | ICD-10-CM

## 2019-07-28 NOTE — Progress Notes (Signed)
Procedure Note  Preoperative Dx: changing skin lesion of right face  Postoperative Dx: Same  Procedure: excision of changing skin lesion of right face 1 cm  Anesthesia: Lidocaine 1% with 1:100,000 epinepherine   Description of Procedure: Risks and complications were explained to the patient.  Consent was confirmed and the patient understands the risks and benefits.  The potential complications and alternatives were explained and the patient consents.  The patient expressed understanding the option of not having the procedure and the risks of a scar.  Time out was called and all information was confirmed to be correct.    The area was prepped and drapped.  Lidocaine 1% with epinepherine was injected in the subcutaneous area.  After waiting several minutes for the local to take affect a #15 blade was used to excise the area in an eliptical pattern with 28mm borders from the biopsy site.  A 5-0 Monocryl was used to close the skin edges. A dressing was applied.  The patient was given instructions on how to care for the area and a follow up appointment.  Kelsey Obrien tolerated the procedure well and there were no complications. The specimen was sent to pathology.

## 2019-07-28 NOTE — Progress Notes (Signed)
Procedure Note  Preoperative Dx: nevus of right upper lid  Postoperative Dx: Same  Procedure: excision of nevus of right upper lid  Anesthesia: Lidocaine 1% with 1:100,000 epinepherine   Description of Procedure: Risks and complications were explained to the patient.  Consent was confirmed and the patient understands the risks and benefits.  The potential complications and alternatives were explained and the patient consents.  The patient expressed understanding the option of not having the procedure and the risks of a scar.  Time out was called and all information was confirmed to be correct.    The area was prepped and drapped.  Lidocaine 1% with epinepherine was injected in the subcutaneous area.  After waiting several minutes for the local to take affect a #15 blade was used to excise the area in an eliptical pattern with 77mm borders from the biopsy site.  A 5-0 Monocryl was used to close the skin edges.  A dressing was applied.  The patient was given instructions on how to care for the area and a follow up appointment.  Kelsey Obrien tolerated the procedure well and there were no complications. She did not want the specimen sent to path and I agreed with her dermatologist, it was no worriesome.

## 2019-07-29 ENCOUNTER — Encounter: Payer: Self-pay | Admitting: Plastic Surgery

## 2019-07-31 LAB — NOVEL CORONAVIRUS, NAA: SARS-CoV-2, NAA: NOT DETECTED

## 2019-08-01 ENCOUNTER — Telehealth: Payer: Self-pay

## 2019-08-01 LAB — SURGICAL PATHOLOGY

## 2019-08-01 NOTE — Telephone Encounter (Signed)

## 2019-08-02 ENCOUNTER — Ambulatory Visit (INDEPENDENT_AMBULATORY_CARE_PROVIDER_SITE_OTHER): Payer: BC Managed Care – PPO

## 2019-08-02 ENCOUNTER — Telehealth: Payer: Self-pay

## 2019-08-02 ENCOUNTER — Other Ambulatory Visit: Payer: Self-pay

## 2019-08-02 VITALS — BP 111/72 | HR 75 | Temp 98.2°F | Ht 64.0 in | Wt 146.0 lb

## 2019-08-02 DIAGNOSIS — L989 Disorder of the skin and subcutaneous tissue, unspecified: Secondary | ICD-10-CM

## 2019-08-02 NOTE — Telephone Encounter (Signed)
Pt in office for f/u of excision of changing skin lesion right face by Dr. Marla Roe on 07/28/19 Path  results/report-presented to pt- ( benign lesion) Steri strip removed & sutures removed- incision is intact, minimal redness, slight tenderness, no drainage or other complications. Minimal dermabond applied with steristrip Pt is instructed to leave steri  Strip in place for next 3-4 days & can get the area wet.   right eye lesion/area is healing- she reported to Dr. Marla Roe over the weekend that this area did open when she scratched her eye- but it is healing & no complications- she is using vaseline on it per Dr. Marla Roe  She will call for any changes or concerns [facility]

## 2019-11-14 ENCOUNTER — Telehealth: Payer: Self-pay

## 2019-11-14 NOTE — Telephone Encounter (Signed)
Opened in error

## 2019-12-13 ENCOUNTER — Other Ambulatory Visit: Payer: BC Managed Care – PPO

## 2019-12-14 ENCOUNTER — Other Ambulatory Visit: Payer: BC Managed Care – PPO

## 2020-06-04 ENCOUNTER — Telehealth: Payer: Self-pay

## 2020-06-04 NOTE — Patient Instructions (Signed)
Reviewed path report- & pt understands to call for any concerns

## 2020-06-04 NOTE — Telephone Encounter (Signed)
Opened in error

## 2020-06-04 NOTE — Progress Notes (Signed)
Note from office visit on 07/28/19 Pt in office for f/u of excision of changing skin lesion right face by Dr. Marla Roe on 07/28/2019 Path report results presented to pt (benign lesion) Steri-strip removed & sutures removed, incision is intact, minimal redness,slight tenderness, no drainage or other complications. minimal dermabond applied with steri-strip Pt is instructed to leave steri-strip in place for next 3-4 days & can get the area wet.  Right eye lesion/area is healing- she reported to Dr. Marla Roe over the weekend that this area did open when she scratched her eye- but is healing & no complications She is using vaseline on it per Dr. Marla Roe  She will call for any changes or concerns
